# Patient Record
Sex: Male | Born: 1950 | Race: Black or African American | Hispanic: No | Marital: Married | Smoking: Current every day smoker
Health system: Southern US, Community
[De-identification: ages and names within clinical notes are randomized; demographics above are authoritative.]

## PROBLEM LIST (undated history)

## (undated) DIAGNOSIS — I10 Essential (primary) hypertension: Secondary | ICD-10-CM

## (undated) DIAGNOSIS — E119 Type 2 diabetes mellitus without complications: Secondary | ICD-10-CM

## (undated) HISTORY — PX: BLADDER SURGERY: SHX569

## (undated) HISTORY — PX: HERNIA REPAIR: SHX51

---

## 2011-10-19 ENCOUNTER — Ambulatory Visit: Payer: Self-pay

## 2014-12-22 ENCOUNTER — Ambulatory Visit: Payer: Self-pay | Admitting: Physician Assistant

## 2016-07-28 ENCOUNTER — Ambulatory Visit
Admission: EM | Admit: 2016-07-28 | Discharge: 2016-07-28 | Disposition: A | Discharge status: Home / Self Care | Payer: Self-pay | Attending: Emergency Medicine | Admitting: Emergency Medicine

## 2016-07-28 ENCOUNTER — Ambulatory Visit (INDEPENDENT_AMBULATORY_CARE_PROVIDER_SITE_OTHER): Payer: Self-pay

## 2016-07-28 DIAGNOSIS — J40 Bronchitis, not specified as acute or chronic: Secondary | ICD-10-CM

## 2016-07-28 HISTORY — DX: Type 2 diabetes mellitus without complications: E11.9

## 2016-07-28 HISTORY — DX: Essential (primary) hypertension: I10

## 2016-07-28 MED ORDER — ALBUTEROL SULFATE HFA 108 (90 BASE) MCG/ACT IN AERS: 1.0000 | INHALATION_SPRAY | Freq: Four times a day (QID) | RESPIRATORY_TRACT | 0 refills | Status: DC | PRN

## 2016-07-28 MED ORDER — AEROCHAMBER PLUS MISC: 2 refills | Status: DC

## 2016-07-28 MED ORDER — PREDNISONE 20 MG PO TABS: 40.0000 mg | ORAL_TABLET | Freq: Every day | ORAL | 0 refills | Status: AC

## 2016-07-28 MED ORDER — AZITHROMYCIN 250 MG PO TABS: 250.0000 mg | ORAL_TABLET | Freq: Every day | ORAL | 0 refills | Status: DC

## 2016-07-28 MED ORDER — GUAIFENESIN-CODEINE 100-10 MG/5ML PO SYRP: 10.0000 mL | ORAL_SOLUTION | Freq: Four times a day (QID) | ORAL | 0 refills | Status: DC | PRN

## 2016-07-28 NOTE — ED Triage Notes (Signed)
Patient complains of severe cough with heavy mucus production with sudden onset of yesterday. Patient states that he is also feeling weak.

## 2016-07-28 NOTE — ED Provider Notes (Signed)
HPI  SUBJECTIVE:  Alejandro Garza is a 65 y.o. male who presents with the acute onset of cough productive of yellowish, greenish, whitish sputum, fatigue yesterday. States he feels feverish but has no documented fevers at home. Reports chills, nasal congestion, rhinorrhea, postnasal drip. He reports chest soreness after coughing, but no chest pain. Reports wheezing, occasional shortness of breath. Reports some body aches, but no headaches. He reports sneezing and itchy watery eyes. He also states that he has been around others with similar symptoms. He tried Mucinex, Alka-Seltzer without improvement. There are no other aggravating or alleviating factors. He has not taken any antipyretic in the past 6-8 hours. He is concerned about pneumonia. He denies weight gain, lower extremity edema, PND, orthopnea, nocturia. He did get a flu shot this year. Patient has a questionable history of asthma, states he has an inhaler at home which he does not use. Also questionable history of CHF, patient states that he has had "fluid in his lungs" in the past, but is not sure about this diagnosis. He is smoker half pack a day for the past 20 years. Also diabetes, hypertension, allergies. Not sure if he has had pneumonia in the past. He also has OSA but has not used his CPAP machine yet. No history of GERD. PMD: [facility]    Past Medical History:  Diagnosis Date  . Diabetes (HCC)   . Hypertension     History reviewed. No pertinent surgical history.  No family history on file.  Social History  Substance Use Topics  . Smoking status: Current Every Day Smoker    Packs/day: 0.50  . Smokeless tobacco: Never Used  . Alcohol use Yes    No current facility-administered medications for this encounter.   Current Outpatient Prescriptions:  .  hydrochlorothiazide (HYDRODIURIL) 25 MG tablet, Take 25 mg by mouth daily., Disp: , Rfl:  .  metFORMIN (GLUCOPHAGE) 500 MG tablet, Take by mouth 2 (two) times  daily with a meal., Disp: , Rfl:  .  albuterol (PROVENTIL HFA;VENTOLIN HFA) 108 (90 Base) MCG/ACT inhaler, Inhale 1-2 puffs into the lungs every 6 (six) hours as needed for wheezing or shortness of breath., Disp: 1 Inhaler, Rfl: 0 .  azithromycin (ZITHROMAX) 250 MG tablet, Take 1 tablet (250 mg total) by mouth daily. 2 tabs po on day 1, 1 tab po on days 2-5, Disp: 6 tablet, Rfl: 0 .  guaiFENesin-codeine (CHERATUSSIN AC) 100-10 MG/5ML syrup, Take 10 mLs by mouth 4 (four) times daily as needed for cough., Disp: 120 mL, Rfl: 0 .  predniSONE (DELTASONE) 20 MG tablet, Take 2 tablets (40 mg total) by mouth daily with breakfast., Disp: 10 tablet, Rfl: 0 .  Spacer/Aero-Holding Chambers (AEROCHAMBER PLUS) inhaler, Use as instructed, Disp: 1 each, Rfl: 2  Allergies  Allergen Reactions  . Aspirin      ROS  As noted in HPI.   Physical Exam  BP (!) 168/75 (BP Location: Left Arm)   Pulse 66   Temp 97.7 F (36.5 C) (Tympanic)   Resp 17   Ht 6' 1.5" (1.867 m)   Wt 280 lb (127 kg)   SpO2 96%   BMI 36.44 kg/m   Constitutional: Well developed, well nourished, no acute distress. Speaking in full sentences. Coughing up whitish sputum. Eyes: PERRL, EOMI, conjunctiva normal bilaterally HENT: Normocephalic, atraumatic,mucus membranes moist. No nasal congestion. Patient unable to tolerate oropharyngeal exam. Respiratory: Clear to auscultation bilaterally, no rales, no wheezing, no rhonchi. Good air movement. Cardiovascular: Normal  rate and rhythm, no murmurs, no gallops, no rubs GI: nondistended,  skin: No rash, skin intact Musculoskeletal: calves symmetric, nontender No edema, no deformities Neurologic: Alert & oriented x 3, CN II-XII grossly intact, no motor deficits, sensation grossly intact Psychiatric: Speech and behavior appropriate   ED Course   Medications - No data to display  Orders Placed This Encounter  Procedures  . DG Chest 2 View    Standing Status:   Standing    Number of  Occurrences:   1    Order Specific Question:   Reason for Exam (SYMPTOM  OR DIAGNOSIS REQUIRED)    Answer:   cough r/o PNA CHF   No results found for this or any previous visit (from the past 24 hour(s)). Dg Chest 2 View  Result Date: 07/28/2016 CLINICAL DATA:  Cough and shortness of breath for 2 days. EXAM: CHEST  2 VIEW COMPARISON:  None. FINDINGS: Upper limits normal heart size noted. The mediastinal silhouette is otherwise unremarkable. Elevation of the right hemidiaphragm is present with mild right basilar atelectasis/scarring. There is no evidence of focal airspace disease, pulmonary edema, suspicious pulmonary nodule/mass, pleural effusion, or pneumothorax. No acute bony abnormalities are identified. IMPRESSION: Upper limits normal heart size with right hemidiaphragm elevation-likely chronic. No evidence of pneumonia or acute abnormality otherwise. Electronically Signed   By: Harmon PierJeffrey  Hu M.D.   On: 07/28/2016 09:37    ED Clinical Impression  Bronchitis   ED Assessment/Plan  Presentation most consistent with a bronchitis/URI however we'll get a chest x-ray rule out pneumonia or CHF as patient does have multiple comorbidities. Doubt CHF as he has no lower extremity edema or other sx.   Imaging independently reviewed. No evidence pneumonia, CHF or acute cardiopulmonary disease. See radiology report for details.  We'll treat for bronchitis to send home with regular albuterol with spacer, steroids, although advised him that this will elevate his sugars, and a wait-and-see prescription of azithromycin as he does have multiple comorbidities that could easily lead to pneumonia. Follow Up with PMD at [facility] as needed. To the ER gets worse.  Discussed  imaging, MDM, plan and followup with patient. Discussed sn/sx that should prompt return to the ED. Patient agrees with plan.   Meds ordered this encounter  Medications  . hydrochlorothiazide (HYDRODIURIL) 25 MG tablet    Sig: Take 25 mg by mouth  daily.  . metFORMIN (GLUCOPHAGE) 500 MG tablet    Sig: Take by mouth 2 (two) times daily with a meal.  . predniSONE (DELTASONE) 20 MG tablet    Sig: Take 2 tablets (40 mg total) by mouth daily with breakfast.    Dispense:  10 tablet    Refill:  0  . albuterol (PROVENTIL HFA;VENTOLIN HFA) 108 (90 Base) MCG/ACT inhaler    Sig: Inhale 1-2 puffs into the lungs every 6 (six) hours as needed for wheezing or shortness of breath.    Dispense:  1 Inhaler    Refill:  0  . Spacer/Aero-Holding Chambers (AEROCHAMBER PLUS) inhaler    Sig: Use as instructed    Dispense:  1 each    Refill:  2  . guaiFENesin-codeine (CHERATUSSIN AC) 100-10 MG/5ML syrup    Sig: Take 10 mLs by mouth 4 (four) times daily as needed for cough.    Dispense:  120 mL    Refill:  0  . azithromycin (ZITHROMAX) 250 MG tablet    Sig: Take 1 tablet (250 mg total) by mouth daily. 2 tabs po on day  1, 1 tab po on days 2-5    Dispense:  6 tablet    Refill:  0    *This clinic note was created using Scientist, clinical (histocompatibility and immunogenetics). Therefore, there may be occasional mistakes despite careful proofreading.  ?   Domenick Gong, MD 07/28/16 1038

## 2016-07-28 NOTE — Discharge Instructions (Signed)
Take two puffs from your albuterol inhaler every 4-6 hours as needed for coughing and wheezing. Finish the steroids unless your doctor tells you to stop. Try the albuterol and steroids and cough syrup for a day or 2, and if you're not getting better or if you've a fever above 100.4, then start the antibiotics. The prednisone will elevate your sugar while you're taking it, but it should normalize when he finished the steroids.. Return to the ER if you get worse, have a fever >100.4, or any other concerns.   Go to www.goodrx.com to look up your medications. This will give you a list of where you can find your prescriptions at the most affordable prices.

## 2017-10-08 ENCOUNTER — Other Ambulatory Visit: Payer: Self-pay

## 2017-10-08 ENCOUNTER — Ambulatory Visit
Admission: EM | Admit: 2017-10-08 | Discharge: 2017-10-08 | Disposition: A | Discharge status: Home / Self Care | Payer: 59 | Attending: Emergency Medicine | Admitting: Emergency Medicine

## 2017-10-08 ENCOUNTER — Encounter: Payer: Self-pay | Admitting: Emergency Medicine

## 2017-10-08 ENCOUNTER — Ambulatory Visit (INDEPENDENT_AMBULATORY_CARE_PROVIDER_SITE_OTHER): Payer: 59

## 2017-10-08 DIAGNOSIS — R05 Cough: Secondary | ICD-10-CM

## 2017-10-08 DIAGNOSIS — J22 Unspecified acute lower respiratory infection: Secondary | ICD-10-CM | POA: Diagnosis not present

## 2017-10-08 DIAGNOSIS — R0602 Shortness of breath: Secondary | ICD-10-CM | POA: Diagnosis not present

## 2017-10-08 MED ORDER — BENZONATATE 200 MG PO CAPS: 200.0000 mg | ORAL_CAPSULE | Freq: Three times a day (TID) | ORAL | 0 refills | Status: DC | PRN

## 2017-10-08 MED ORDER — AEROCHAMBER PLUS MISC: 2 refills | Status: DC

## 2017-10-08 MED ORDER — ALBUTEROL SULFATE HFA 108 (90 BASE) MCG/ACT IN AERS: 1.0000 | INHALATION_SPRAY | Freq: Four times a day (QID) | RESPIRATORY_TRACT | 0 refills | Status: DC | PRN

## 2017-10-08 MED ORDER — IPRATROPIUM-ALBUTEROL 0.5-2.5 (3) MG/3ML IN SOLN
3.0000 mL | Freq: Once | RESPIRATORY_TRACT | Status: AC
  Administered 2017-10-08: 3 mL via RESPIRATORY_TRACT

## 2017-10-08 MED ORDER — DOXYCYCLINE HYCLATE 100 MG PO CAPS: 100.0000 mg | ORAL_CAPSULE | Freq: Two times a day (BID) | ORAL | 0 refills | Status: AC

## 2017-10-08 MED ORDER — HYDROCOD POLST-CPM POLST ER 10-8 MG/5ML PO SUER: 5.0000 mL | Freq: Two times a day (BID) | ORAL | 0 refills | Status: DC | PRN

## 2017-10-08 NOTE — Discharge Instructions (Signed)
1-2 puffs from your albuterol inhaler every 4-6 hours as needed for coughing, wheezing.  Use a spacer.  Finish the doxycycline unless your doctor tells you to stop.  Start some Flonase.  Tessalon Perles for the cough during the day, Tussionex for the cough at night

## 2017-10-08 NOTE — ED Provider Notes (Signed)
HPI  SUBJECTIVE:  Alejandro Garza is a 66 y.o. male who presents with 1-2 weeks of chest congestion, nasal congestion, rhinorrhea, postnasal drip, scratchy throat.  He reports the cough productive of the same material as his nasal congestion.  Reports body aches, sinus pain and pressure, wheezing, shortness of breath.  Reports chest soreness secondary to the cough.  No documented fevers at home, but has not taken his temperature.  Reports chills.  No upper dental pain.  States that overall he is getting worse.  He tried Sudafed, NyQuil, Robitussin with temporary improvement in symptoms.  No aggravating factors.  No antibiotics in the past month.  No antipyretic in the past 6-8 hours.  States that he is unable to sleep at night secondary to the cough.  He has a past medical history of asthma, and is a smoker.  Also diabetes, hypertension, hypercholesterolemia.  No history of emphysema, COPD.  [facility]  Past Medical History:  Diagnosis Date  . Diabetes (HCC)   . Hypertension     Past Surgical History:  Procedure Laterality Date  . BLADDER SURGERY    . HERNIA REPAIR      Family History  Problem Relation Age of Onset  . Diabetes Mother   . Diabetes Father   . Cancer Father        colon    Social History   Tobacco Use  . Smoking status: Current Every Day Smoker    Packs/day: 0.50  . Smokeless tobacco: Never Used  Substance Use Topics  . Alcohol use: Yes    Alcohol/week: 1.2 oz    Types: 2 Standard drinks or equivalent per week  . Drug use: No    No current facility-administered medications for this encounter.   Current Outpatient Medications:  .  hydrochlorothiazide (HYDRODIURIL) 25 MG tablet, Take 25 mg by mouth daily., Disp: , Rfl:  .  metFORMIN (GLUCOPHAGE) 500 MG tablet, Take by mouth 2 (two) times daily with a meal., Disp: , Rfl:  .  albuterol (PROVENTIL HFA;VENTOLIN HFA) 108 (90 Base) MCG/ACT inhaler, Inhale 1-2 puffs into the lungs every 6 (six) hours  as needed for wheezing or shortness of breath., Disp: 1 Inhaler, Rfl: 0 .  benzonatate (TESSALON) 200 MG capsule, Take 1 capsule (200 mg total) by mouth 3 (three) times daily as needed for cough., Disp: 30 capsule, Rfl: 0 .  chlorpheniramine-HYDROcodone (TUSSIONEX PENNKINETIC ER) 10-8 MG/5ML SUER, Take 5 mLs by mouth every 12 (twelve) hours as needed for cough., Disp: 120 mL, Rfl: 0 .  doxycycline (VIBRAMYCIN) 100 MG capsule, Take 1 capsule (100 mg total) by mouth 2 (two) times daily for 7 days., Disp: 14 capsule, Rfl: 0 .  Spacer/Aero-Holding Chambers (AEROCHAMBER PLUS) inhaler, Use as instructed, Disp: 1 each, Rfl: 2  Allergies  Allergen Reactions  . Aspirin      ROS  As noted in HPI.   Physical Exam  BP (!) 134/59 (BP Location: Left Arm)   Pulse 72   Temp 98.2 F (36.8 C) (Oral)   Resp 20   Ht 6\' 2"  (1.88 m)   Wt 290 lb (131.5 kg)   SpO2 99%   BMI 37.23 kg/m   Constitutional: Well developed, well nourished, no acute distress Eyes:  EOMI, conjunctiva normal bilaterally HENT: Normocephalic, atraumatic,mucus membranes moist.  Mild nasal congestion, normal turbinates.  No sinus tenderness.  Unable to adequately visualize oropharynx. Respiratory: Normal inspiratory effort, fair air movement, lungs clear bilaterally cardiovascular: Normal rate regular rhythm, no  murmurs, gallops GI: nondistended skin: No rash, skin intact Musculoskeletal: no deformities Neurologic: Alert & oriented x 3, no focal neuro deficits Psychiatric: Speech and behavior appropriate   ED Course   Medications  ipratropium-albuterol (DUONEB) 0.5-2.5 (3) MG/3ML nebulizer solution 3 mL (3 mLs Nebulization Given 10/08/17 1602)    Orders Placed This Encounter  Procedures  . DG Chest 2 View    Standing Status:   Standing    Number of Occurrences:   1    Order Specific Question:   Reason for Exam (SYMPTOM  OR DIAGNOSIS REQUIRED)    Answer:   r/o pna edema effusion    No results found for this or any  previous visit (from the past 24 hour(s)). Dg Chest 2 View  Result Date: 10/08/2017 CLINICAL DATA:  Coughing, SOB, wheezing X 2 weeks. Pt states he just doesn't feel well. Fever he believes off/on. No surgeries. Smoker. EXAM: CHEST  2 VIEW COMPARISON:  07/28/2016 FINDINGS: Cardiac silhouette is normal in size. No mediastinal or hilar masses. No evidence of adenopathy. Clear lungs. Elevated right hemidiaphragm, stable. No pleural effusion or pneumothorax. Skeletal structures are intact. IMPRESSION: No active cardiopulmonary disease. Electronically Signed   By: Amie Portland M.D.   On: 10/08/2017 15:58    ED Clinical Impression  LRTI (lower respiratory tract infection)   ED Assessment/Plan  [place] Narcotic database reviewed for this patient, and feel that the risk/benefit ratio today is favorable for proceeding with a prescription for controlled substance.  2 opiate prescriptions in 2 years.  Imaging independently reviewed.  No pneumonia, pleural effusion, pneumothorax.  See radiology report for details.  On reevaluation, patient states that he feels better.  Lungs still clear.  Good air movement. Presentation consistent with a bronchitis/lower respiratory tract infection., he has no sinus tenderness consistent with sinusitis.  Plan to send home with albuterol inhaler with a spacer, 1-2 puffs every 4-6 hours as needed for coughing, wheezing, shortness of breath.  Also, Tessalon Perles during the day, Tussionex cough syrup for night and doxycycline given duration of symptoms and the fact that he is overall getting worse  Discussed imaging, MDM, plan and followup with patient. Discussed sn/sx that should prompt return to the ED. patient agrees with plan.   Meds ordered this encounter  Medications  . ipratropium-albuterol (DUONEB) 0.5-2.5 (3) MG/3ML nebulizer solution 3 mL  . albuterol (PROVENTIL HFA;VENTOLIN HFA) 108 (90 Base) MCG/ACT inhaler    Sig: Inhale 1-2 puffs into the lungs every 6 (six)  hours as needed for wheezing or shortness of breath.    Dispense:  1 Inhaler    Refill:  0  . Spacer/Aero-Holding Chambers (AEROCHAMBER PLUS) inhaler    Sig: Use as instructed    Dispense:  1 each    Refill:  2  . benzonatate (TESSALON) 200 MG capsule    Sig: Take 1 capsule (200 mg total) by mouth 3 (three) times daily as needed for cough.    Dispense:  30 capsule    Refill:  0  . chlorpheniramine-HYDROcodone (TUSSIONEX PENNKINETIC ER) 10-8 MG/5ML SUER    Sig: Take 5 mLs by mouth every 12 (twelve) hours as needed for cough.    Dispense:  120 mL    Refill:  0  . doxycycline (VIBRAMYCIN) 100 MG capsule    Sig: Take 1 capsule (100 mg total) by mouth 2 (two) times daily for 7 days.    Dispense:  14 capsule    Refill:  0    *This  clinic note was created using Scientist, clinical (histocompatibility and immunogenetics)Dragon dictation software. Therefore, there may be occasional mistakes despite careful proofreading.   ?   Domenick GongMortenson, Genifer Lazenby, MD 10/08/17 1627

## 2017-10-08 NOTE — ED Triage Notes (Signed)
Patient in today c/o 1 week history of chest congestion, nasal congestion and productive cough (yellow). Patient has felt feverish, but has not taken his temperature. Patient does feel weak. Patient has tried OTC Robitussin and Nyquil without relief.

## 2018-02-11 IMAGING — CR DG CHEST 2V
2 series · 3 of 3 positions shown · non-contrast
Comparison: None.

CLINICAL DATA: Cough and shortness of breath for 2 days.

EXAM:
CHEST  2 VIEW

[Series 1: chest pa · 0.14mm/px · 2 of 2 slices shown]
[im 1/2]
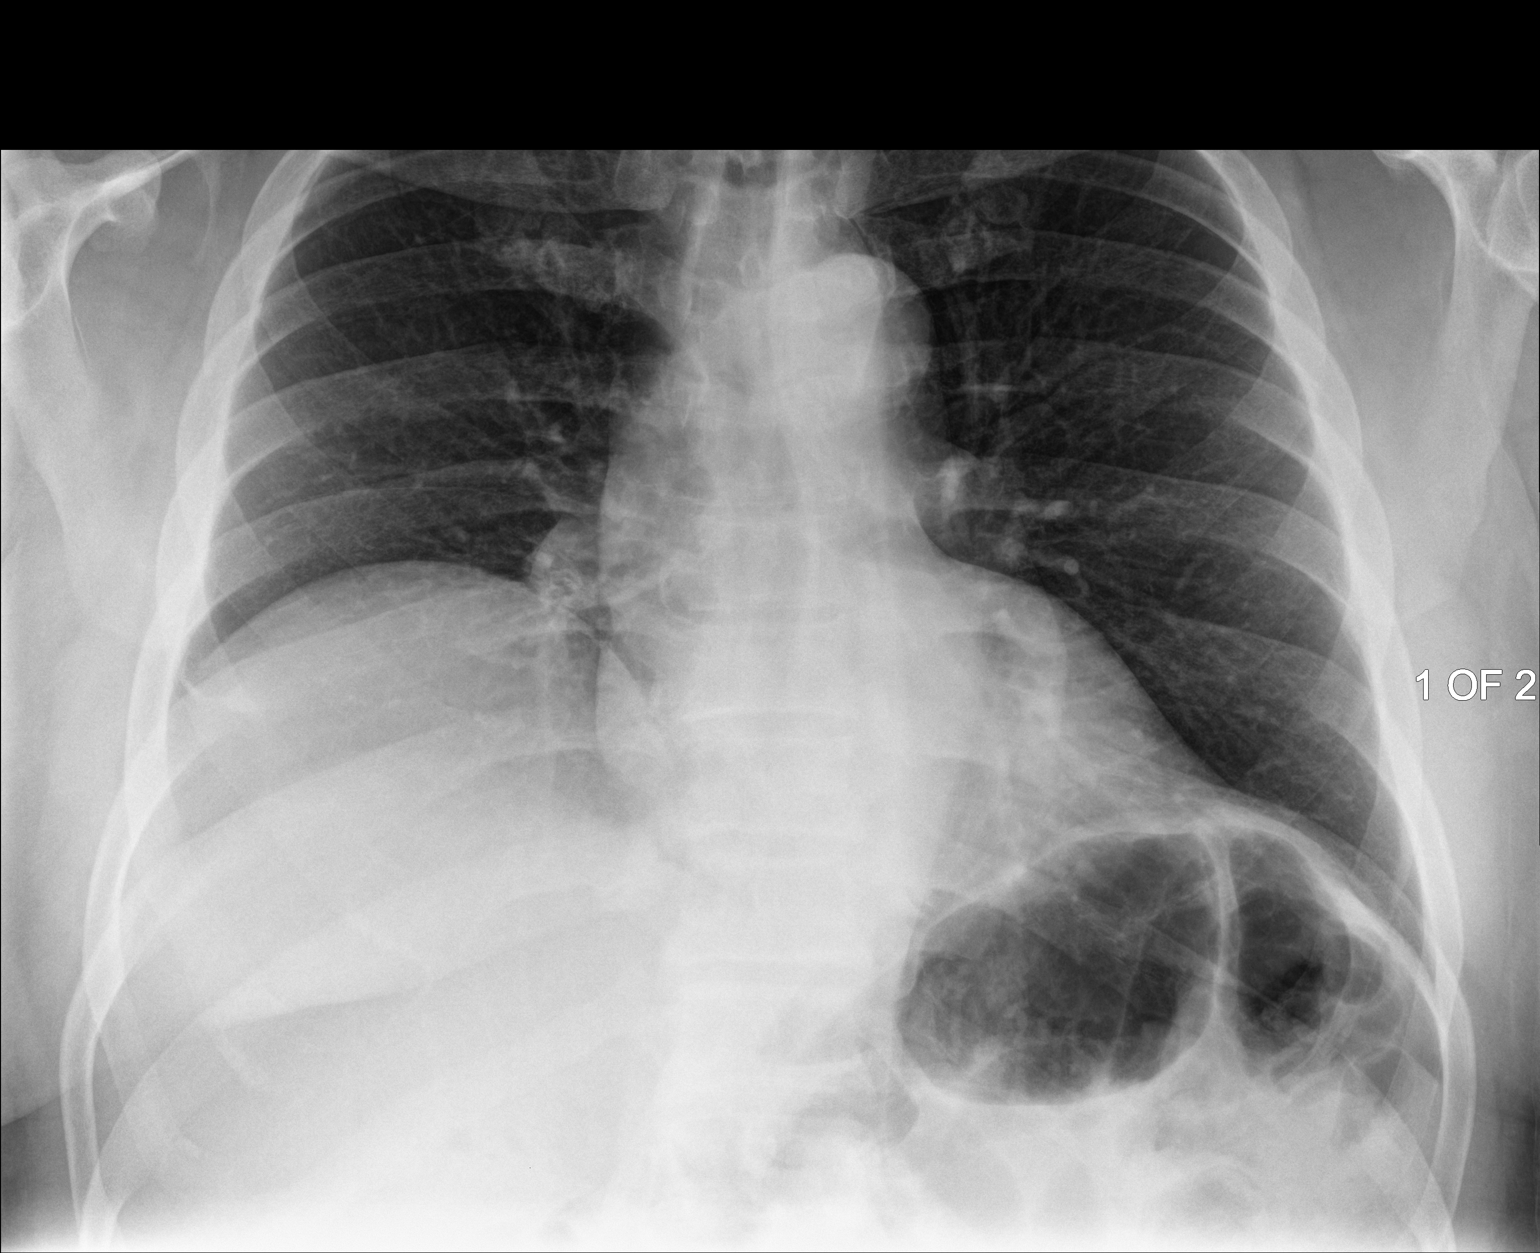
[im 2/2]
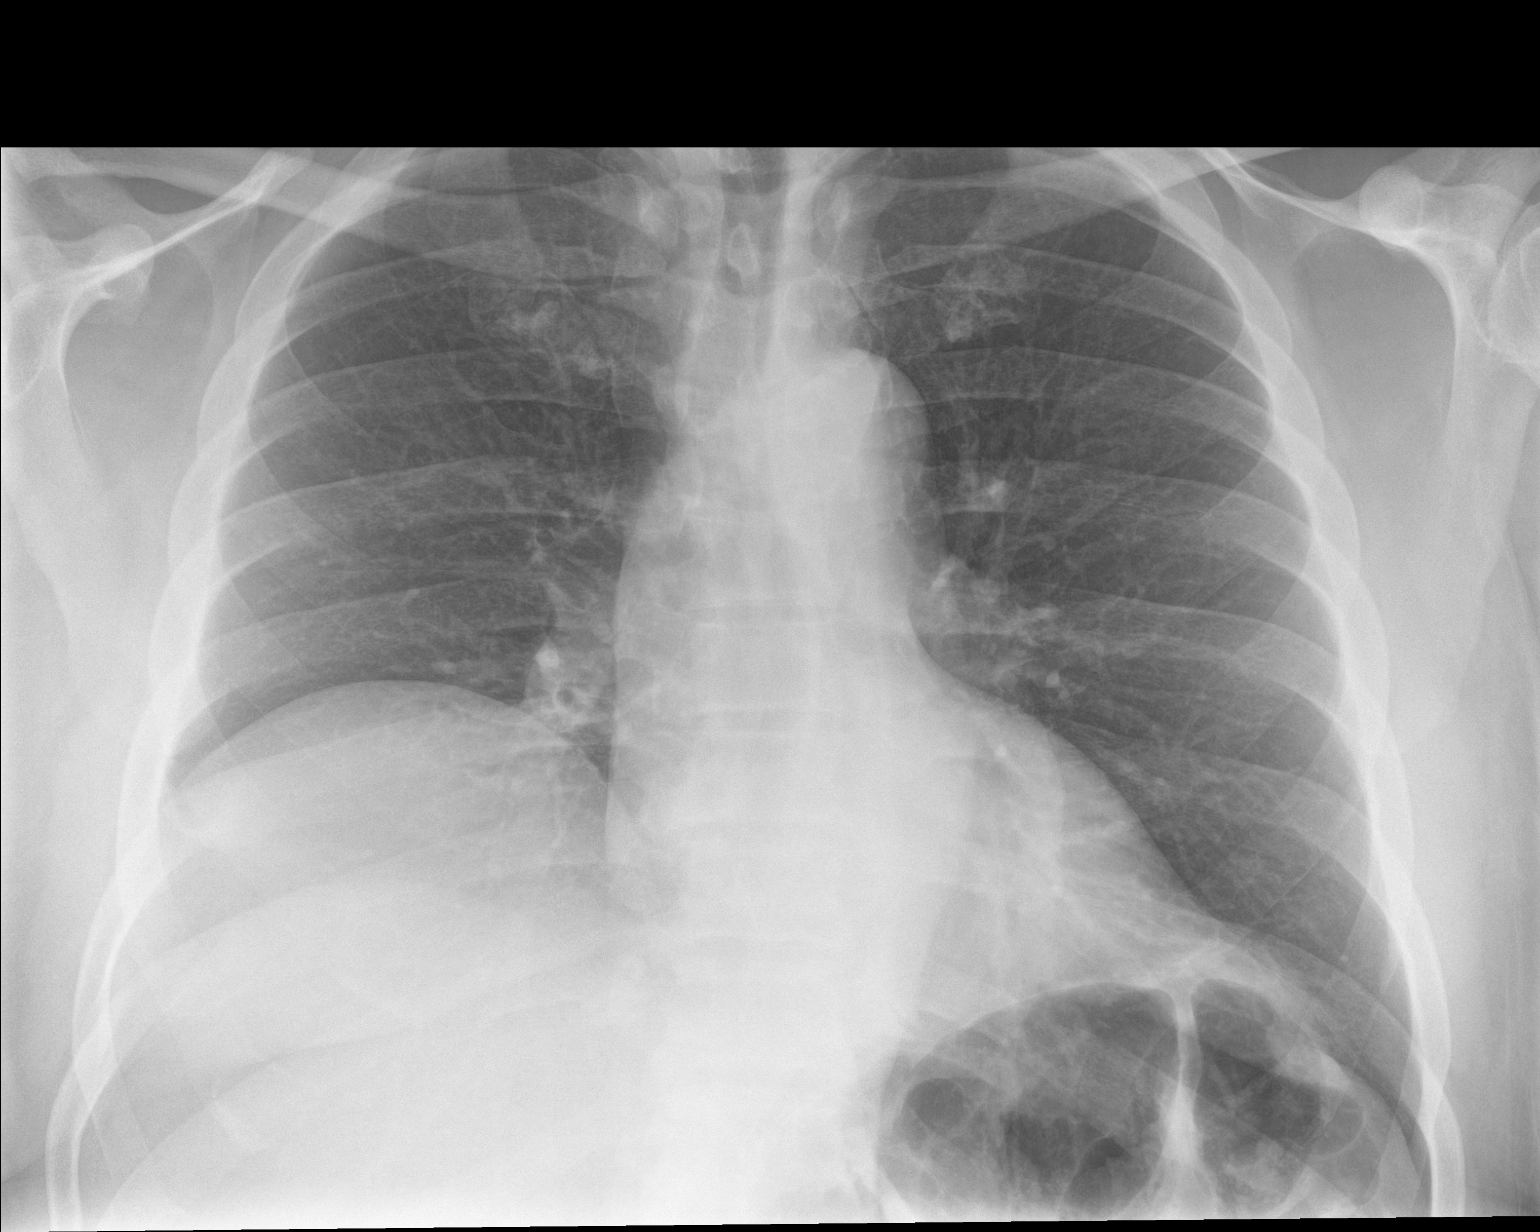

[chest lat]
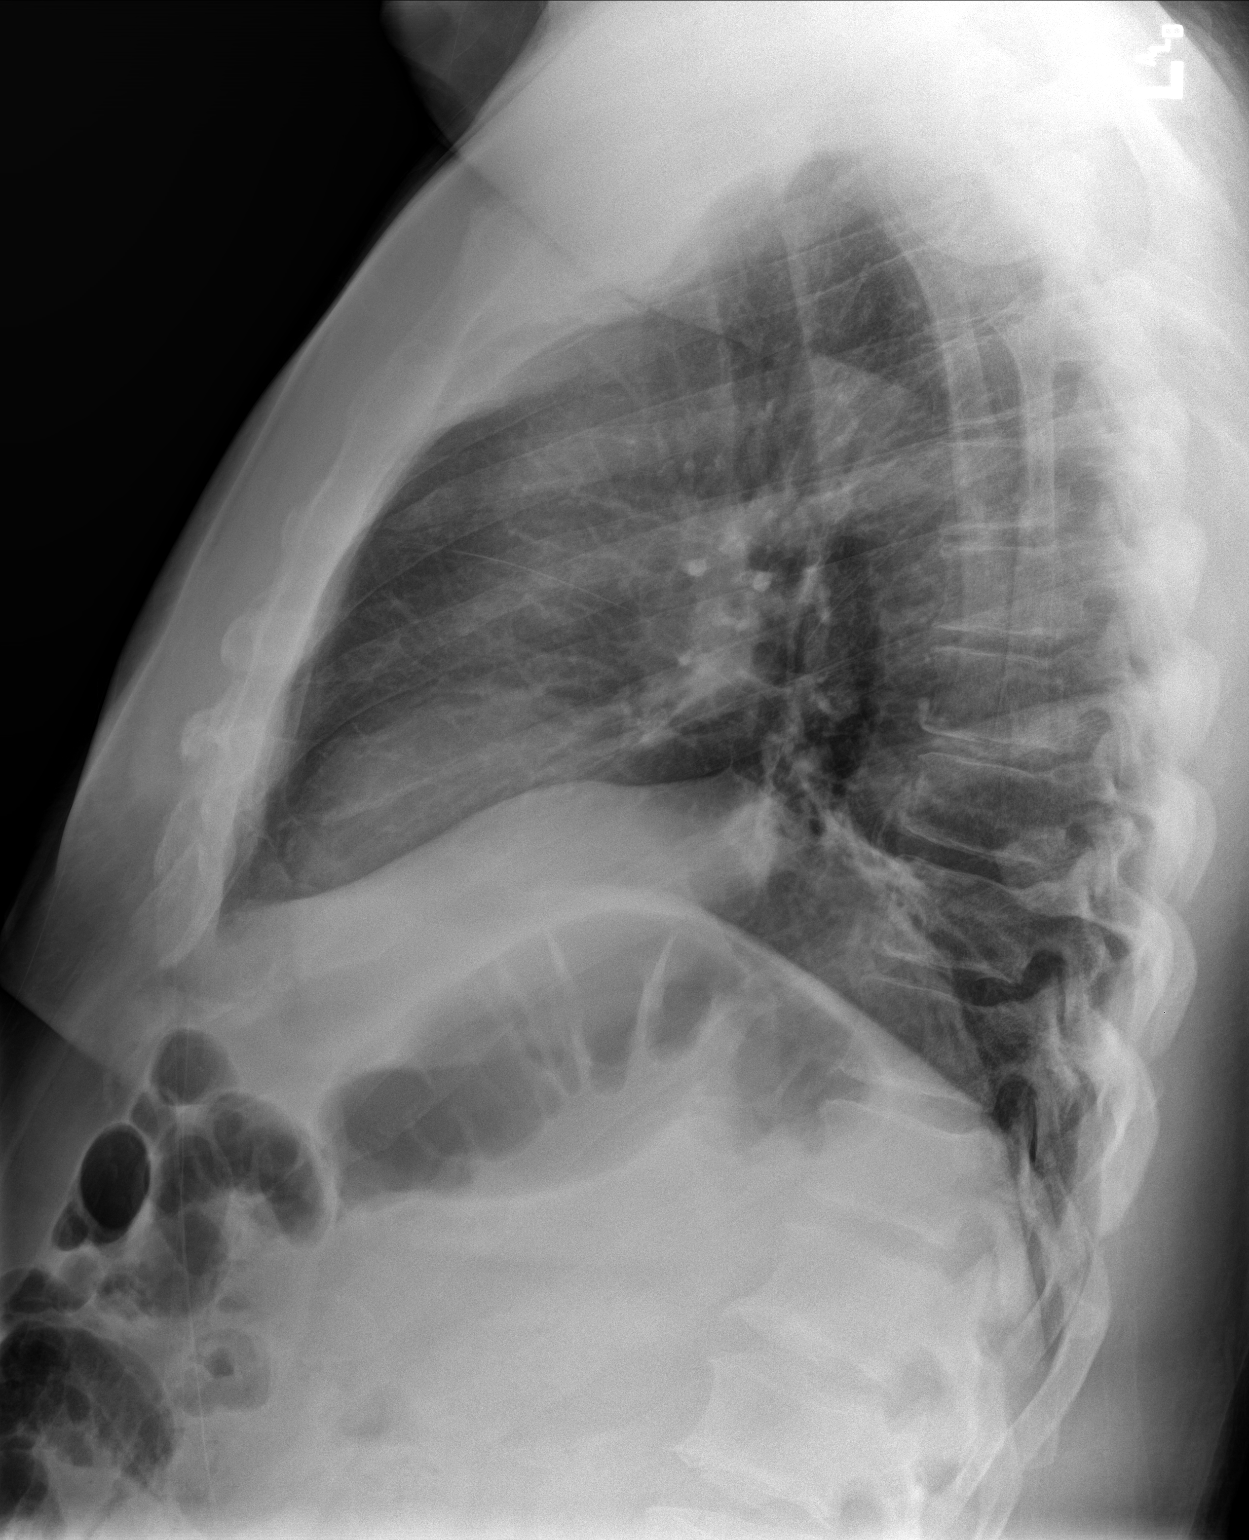

[3 of 3 positions shown; findings below may reference images not displayed]

FINDINGS: Upper limits normal heart size noted. The mediastinal silhouette is
otherwise unremarkable.

Elevation of the right hemidiaphragm is present with mild right
basilar atelectasis/scarring.

There is no evidence of focal airspace disease, pulmonary edema,
suspicious pulmonary nodule/mass, pleural effusion, or pneumothorax.

No acute bony abnormalities are identified.
IMPRESSION: Upper limits normal heart size with right hemidiaphragm
elevation-likely chronic.

No evidence of pneumonia or acute abnormality otherwise.

## 2018-10-04 ENCOUNTER — Other Ambulatory Visit: Payer: Self-pay

## 2018-10-04 ENCOUNTER — Encounter: Payer: Self-pay | Admitting: Emergency Medicine

## 2018-10-04 ENCOUNTER — Ambulatory Visit
Admission: EM | Admit: 2018-10-04 | Discharge: 2018-10-04 | Disposition: A | Discharge status: Home / Self Care | Payer: 59 | Attending: Family Medicine | Admitting: Family Medicine

## 2018-10-04 DIAGNOSIS — M542 Cervicalgia: Secondary | ICD-10-CM | POA: Insufficient documentation

## 2018-10-04 MED ORDER — METAXALONE 800 MG PO TABS: 800.0000 mg | ORAL_TABLET | Freq: Three times a day (TID) | ORAL | 0 refills | Status: DC | PRN

## 2018-10-04 MED ORDER — MELOXICAM 15 MG PO TABS: 15.0000 mg | ORAL_TABLET | Freq: Every day | ORAL | 0 refills | Status: DC | PRN

## 2018-10-04 NOTE — ED Triage Notes (Signed)
Patient in today c/o neck pain and stiffness x 1 week.

## 2018-10-04 NOTE — ED Provider Notes (Signed)
[facility]    CSN: [id number] Arrival date & time: 10/04/18  0903  History   Chief Complaint Chief Complaint  Patient presents with  . Neck Pain   HPI  67 year old male presents with neck pain.  Bilateral, posterior neck pain for the past 2 weeks.  Has been worse this past week.  Moderate in severity.  Patient denies the use of heat or ice or Tylenol for his pain.  Patient states that he takes Flexeril twice a day and this is not resolved his pain.  Seems to be worse with activity.  No relieving factors.  No reports of radicular symptoms.  No other complaints or concerns at this time.  PMH, Surgical Hx, Family Hx, Social History reviewed and updated as below.  Past Medical History:  Diagnosis Date  . Diabetes (HCC)   . Hypertension    Past Surgical History:  Procedure Laterality Date  . BLADDER SURGERY    . HERNIA REPAIR     Home Medications    Prior to Admission medications   Medication Sig Start Date End Date Taking? Authorizing Provider  albuterol (PROVENTIL HFA;VENTOLIN HFA) 108 (90 Base) MCG/ACT inhaler Inhale 1-2 puffs into the lungs every 6 (six) hours as needed for wheezing or shortness of breath. 10/08/17  Yes Domenick GongMortenson, Ashley, MD  alfuzosin (UROXATRAL) 10 MG 24 hr tablet Take 10 mg by mouth daily with breakfast.   Yes [provider]  amLODipine (NORVASC) 10 MG tablet Take 10 mg by mouth daily.   Yes [provider]  cholecalciferol (VITAMIN D3) 25 MCG (1000 UT) tablet Take 1,000 Units by mouth daily.   Yes [provider]  fluticasone (FLONASE) 50 MCG/ACT nasal spray Place 2 sprays into both nostrils daily.   Yes [provider]  glipiZIDE (GLUCOTROL XL) 5 MG 24 hr tablet Take 5 mg by mouth daily with breakfast.   Yes [provider]  hydrALAZINE (APRESOLINE) 10 MG tablet Take 10 mg by mouth 2 (two) times daily. Take 2 tablet two times per day   Yes [provider]  hydrochlorothiazide  (HYDRODIURIL) 25 MG tablet Take 25 mg by mouth daily.   Yes [provider]  Spacer/Aero-Holding Chambers (AEROCHAMBER PLUS) inhaler Use as instructed 10/08/17  Yes Domenick GongMortenson, Ashley, MD  thiamine 100 MG tablet Take 100 mg by mouth daily.   Yes [provider]  meloxicam (MOBIC) 15 MG tablet Take 1 tablet (15 mg total) by mouth daily as needed. 10/04/18   Tommie Samsook, Titus Drone G, DO  metaxalone (SKELAXIN) 800 MG tablet Take 1 tablet (800 mg total) by mouth 3 (three) times daily as needed. 10/04/18   Tommie Samsook, Callen Vancuren G, DO  metFORMIN (GLUCOPHAGE) 500 MG tablet Take by mouth 2 (two) times daily with a meal.    [provider]    Family History Family History  Problem Relation Age of Onset  . Diabetes Mother   . Diabetes Father   . Cancer Father        colon    Social History Social History   Tobacco Use  . Smoking status: Current Every Day Smoker    Packs/day: 0.50  . Smokeless tobacco: Never Used  Substance Use Topics  . Alcohol use: Yes    Alcohol/week: 2.0 standard drinks    Types: 2 Standard drinks or equivalent per week  . Drug use: No     Allergies   Aspirin   Review of Systems Review of Systems  Constitutional: Negative.   Musculoskeletal:  Positive for neck pain and neck stiffness.   Physical Exam Triage Vital Signs ED Triage Vitals  Enc Vitals Group     BP 10/04/18 0928 (!) 168/101     Pulse Rate 10/04/18 0928 79     Resp 10/04/18 0928 16     Temp 10/04/18 0928 97.7 F (36.5 C)     Temp Source 10/04/18 0928 Oral     SpO2 10/04/18 0928 98 %     Weight 10/04/18 0930 (!) 312 lb (141.5 kg)     Height 10/04/18 0930 6\' 2"  (1.88 m)     Head Circumference --      Peak Flow --      Pain Score 10/04/18 0929 6     Pain Loc --      Pain Edu? --      Excl. in GC? --    Updated Vital Signs BP (!) 168/101 (BP Location: Left Arm)   Pulse 79   Temp 97.7 F (36.5 C) (Oral)   Resp 16   Ht 6\' 2"  (1.88 m)   Wt (!) 141.5 kg   SpO2 98%   BMI 40.06  kg/m   Visual Acuity Right Eye Distance:   Left Eye Distance:   Bilateral Distance:    Right Eye Near:   Left Eye Near:    Bilateral Near:     Physical Exam Vitals signs and nursing note reviewed.  Constitutional:      General: He is not in acute distress.    Appearance: Normal appearance.  HENT:     Head: Normocephalic and atraumatic.  Eyes:     General:        Right eye: No discharge.        Left eye: No discharge.     Conjunctiva/sclera: Conjunctivae normal.  Neck:     Comments: Patient with tenderness of the paraspinal muscles bilaterally as well as of the trapezius muscles. Cardiovascular:     Rate and Rhythm: Normal rate and regular rhythm.  Pulmonary:     Effort: Pulmonary effort is normal.     Breath sounds: Normal breath sounds.  Neurological:     Mental Status: He is alert.  Psychiatric:        Mood and Affect: Mood normal.        Behavior: Behavior normal.    UC Treatments / Results  Labs (all labs ordered are listed, but only abnormal results are displayed) Labs Reviewed - No data to display  EKG None  Radiology No results found.  Procedures Procedures (including critical care time)  Medications Ordered in UC Medications - No data to display  Initial Impression / Assessment and Plan / UC Course  I have reviewed the triage vital signs and the nursing notes.  Pertinent labs & imaging results that were available during my care of the patient were reviewed by me and considered in my medical decision making (see chart for details).    [age] year old male presents with musculoskeletal neck pain.  Treating with meloxicam.  Stop Flexeril.  Start Skelaxin.  Supportive care.  Final Clinical Impressions(s) / UC Diagnoses   Final diagnoses:  Neck pain     Discharge Instructions     Stop the flexeril.  Start using the skelaxin and mobic.  If persist, see Orthopedics.  Take care  Dr. Adriana Simasook    ED Prescriptions    Medication Sig Dispense  Auth. Provider   metaxalone (SKELAXIN) 800 MG tablet Take 1 tablet (800 mg total)  by mouth 3 (three) times daily as needed. 30 tablet Danyla Wattley G, DO   meloxicam (MOBIC) 15 MG tablet Take 1 tablet (15 mg total) by mouth daily as needed. 14 tablet Tommie Sams, DO     Controlled Substance Prescriptions [place] Controlled Substance Registry consulted? Not Applicable   Tommie Sams, DO 10/04/18 1000

## 2018-10-04 NOTE — Discharge Instructions (Signed)
Stop the flexeril.  Start using the skelaxin and mobic.  If persist, see Orthopedics.  Take care  Dr. Adriana Simasook

## 2019-04-24 IMAGING — CR DG CHEST 2V
2 series · 2 of 2 positions shown · non-contrast
Comparison: 07/28/2016

CLINICAL DATA: Coughing, SOB, wheezing X 2 weeks. Pt states he just
doesn't feel well. Fever he believes off/on. No surgeries. Smoker.

EXAM:
CHEST  2 VIEW

[chest pa]
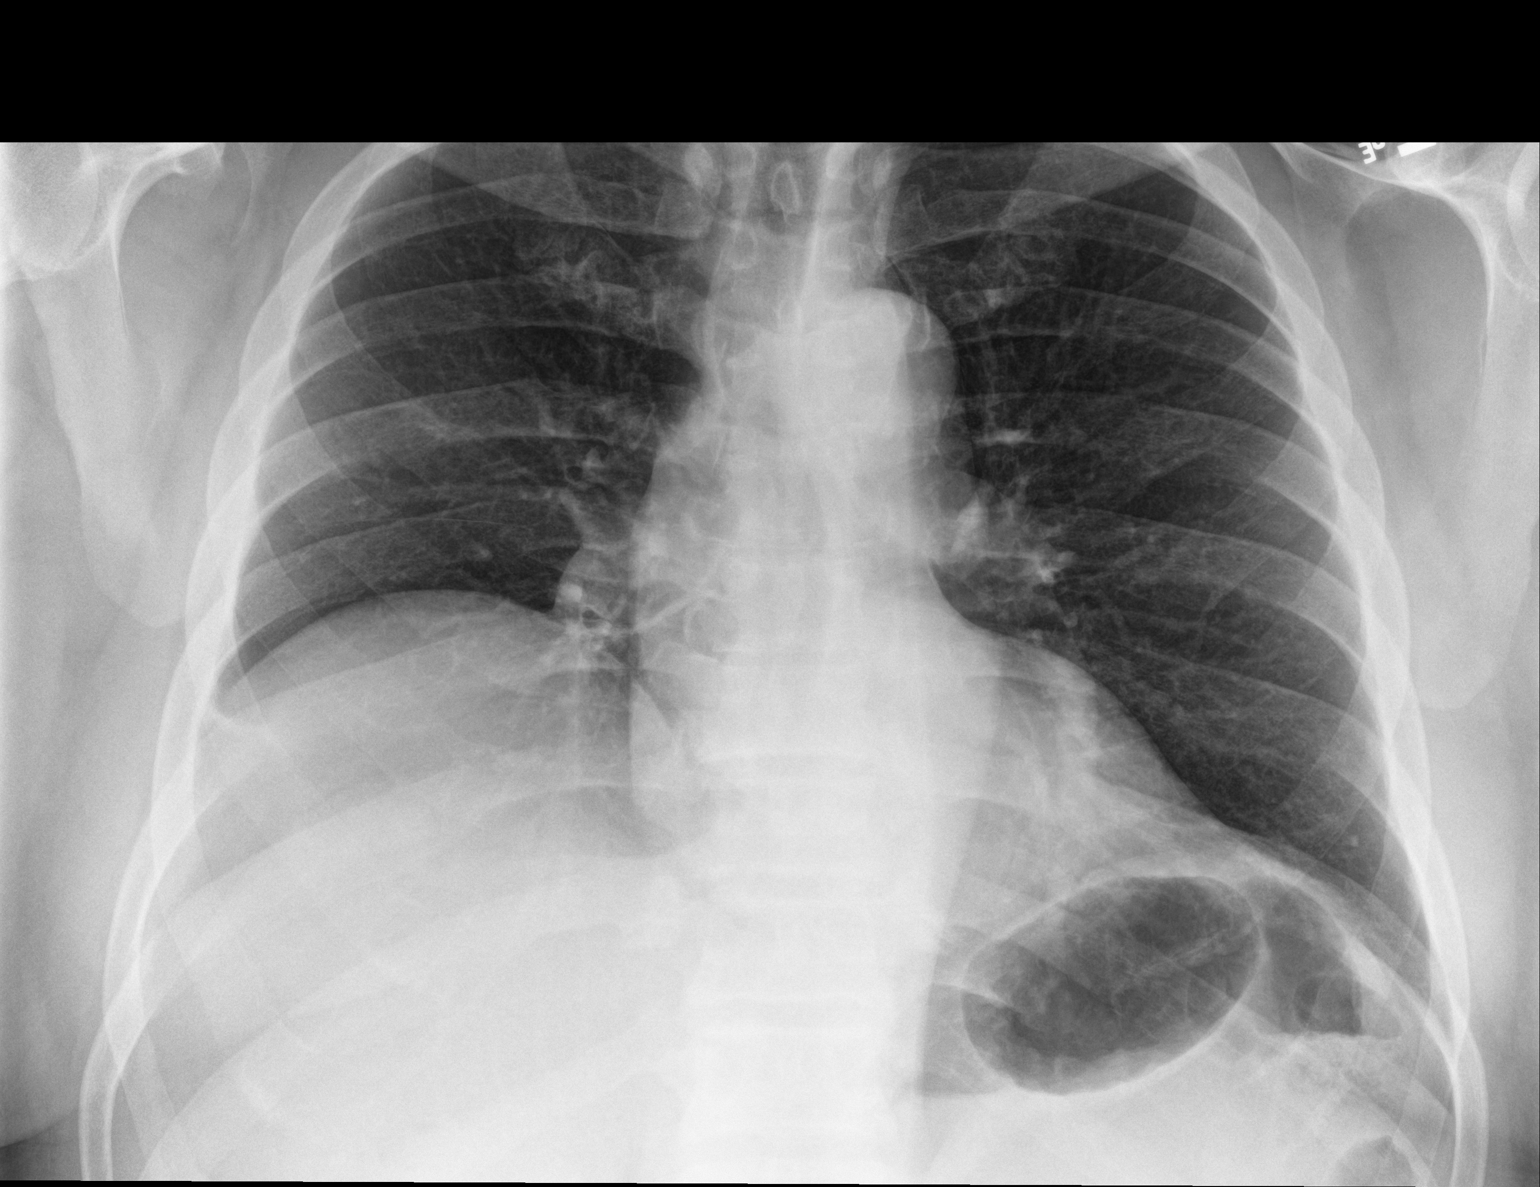

[chest lat]
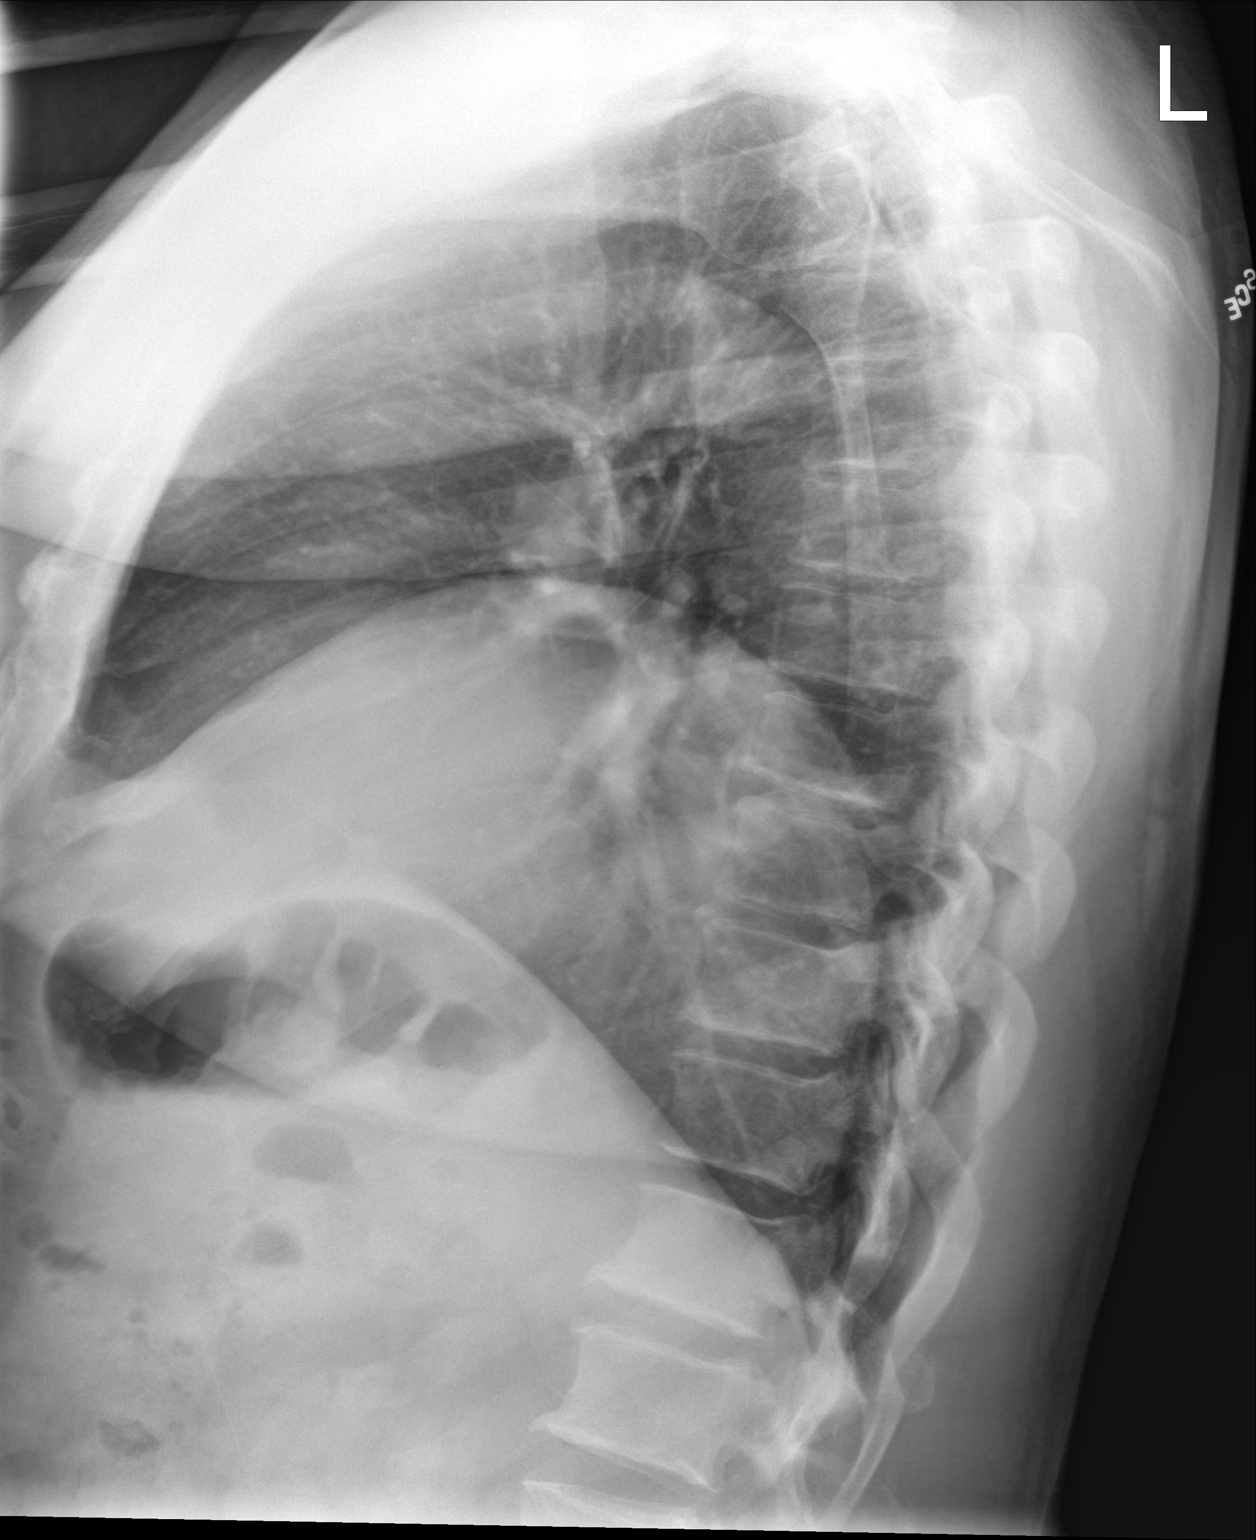

[2 of 2 positions shown; findings below may reference images not displayed]

FINDINGS: Cardiac silhouette is normal in size. No mediastinal or hilar
masses. No evidence of adenopathy.

Clear lungs.

Elevated right hemidiaphragm, stable.

No pleural effusion or pneumothorax.

Skeletal structures are intact.
IMPRESSION: No active cardiopulmonary disease.

## 2020-07-23 ENCOUNTER — Other Ambulatory Visit: Payer: Self-pay

## 2020-07-23 ENCOUNTER — Ambulatory Visit
Admission: EM | Admit: 2020-07-23 | Discharge: 2020-07-23 | Disposition: A | Discharge status: Home / Self Care | Payer: Medicare Other | Attending: Physician Assistant | Admitting: Physician Assistant

## 2020-07-23 DIAGNOSIS — S161XXA Strain of muscle, fascia and tendon at neck level, initial encounter: Secondary | ICD-10-CM

## 2020-07-23 DIAGNOSIS — S39012A Strain of muscle, fascia and tendon of lower back, initial encounter: Secondary | ICD-10-CM | POA: Diagnosis not present

## 2020-07-23 MED ORDER — MELOXICAM 15 MG PO TBDP: 15.0000 mg | ORAL_TABLET | Freq: Every day | ORAL | 0 refills | Status: AC

## 2020-07-23 MED ORDER — BACLOFEN 10 MG PO TABS: 10.0000 mg | ORAL_TABLET | Freq: Three times a day (TID) | ORAL | 0 refills | Status: AC

## 2020-07-23 MED ORDER — TRAMADOL HCL 50 MG PO TABS: 50.0000 mg | ORAL_TABLET | Freq: Four times a day (QID) | ORAL | 0 refills | Status: AC | PRN
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
## 2020-07-23 NOTE — Discharge Instructions (Addendum)
BACK PAIN: Stressed avoiding painful activities . RICE (REST, ICE, COMPRESSION, ELEVATION) guidelines reviewed. May alternate ice and heat. Consider use of muscle rubs, Salonpas patches, etc. Use medications as directed including muscle relaxers if prescribed. Take anti-inflammatory medications as prescribed or OTC NSAIDs/Tylenol.  F/u with PCP in 7-10 days for reexamination, and please feel free to call or return to the urgent care at any time for any questions or concerns you may have and we will be happy to help you!   BACK PAIN RED FLAGS: If the back pain acutely worsens or there are any red flag symptoms such as numbness/tingling, leg weakness, saddle anesthesia, or loss of bowel/bladder control, go immediately to the ER. Follow up with [place] as scheduled or sooner if the pain does not begin to resolve or if it worsens before the follow up    NECK PAIN: Stressed avoiding painful activities. This can exacerbate your symptoms and make them worse.  May apply heat to the areas of pain for some relief. Use medications as directed. Be aware of which medications make you drowsy and do not drive or operate any kind of heavy machinery while using the medication (ie pain medications or muscle relaxers). F/U with PCP for reexamination or return sooner if condition worsens or does not begin to improve over the next few days.   NECK PAIN RED FLAGS: If symptoms get worse than they are right now, you should come back sooner for re-evaluation. If you have increased numbness/ tingling or notice that the numbness/tingling is affecting the legs or saddle region, go to ER. If you ever lose continence go to ER.      If not improving over next 2 weeks, follow up with Orthopedics. Please call one of the following officesfor appointment:   [facility] [address], [place], [place] [address] Phone: [phone]  [facility] [address], [place], [place] [address] Phone: [phone]

## 2021-08-22 ENCOUNTER — Telehealth: Payer: Self-pay | Admitting: Emergency Medicine

## 2021-08-22 ENCOUNTER — Ambulatory Visit
Admission: EM | Admit: 2021-08-22 | Discharge: 2021-08-22 | Disposition: A | Discharge status: Home / Self Care | Payer: Medicare Other | Attending: Emergency Medicine | Admitting: Emergency Medicine

## 2021-08-22 ENCOUNTER — Other Ambulatory Visit: Payer: Self-pay

## 2021-08-22 DIAGNOSIS — J40 Bronchitis, not specified as acute or chronic: Secondary | ICD-10-CM | POA: Diagnosis not present

## 2021-08-22 DIAGNOSIS — J069 Acute upper respiratory infection, unspecified: Secondary | ICD-10-CM | POA: Diagnosis not present

## 2021-08-22 MED ORDER — BENZONATATE 100 MG PO CAPS: 200.0000 mg | ORAL_CAPSULE | Freq: Three times a day (TID) | ORAL | 0 refills | Status: DC

## 2021-08-22 MED ORDER — ALBUTEROL SULFATE HFA 108 (90 BASE) MCG/ACT IN AERS: 1.0000 | INHALATION_SPRAY | Freq: Four times a day (QID) | RESPIRATORY_TRACT | 0 refills | Status: DC | PRN

## 2021-08-22 MED ORDER — ALBUTEROL SULFATE HFA 108 (90 BASE) MCG/ACT IN AERS: 1.0000 | INHALATION_SPRAY | Freq: Four times a day (QID) | RESPIRATORY_TRACT | 0 refills | Status: AC | PRN

## 2021-08-22 MED ORDER — IPRATROPIUM BROMIDE 0.06 % NA SOLN: 2.0000 | Freq: Four times a day (QID) | NASAL | 12 refills | Status: AC

## 2021-08-22 MED ORDER — DOXYCYCLINE HYCLATE 100 MG PO CAPS: 100.0000 mg | ORAL_CAPSULE | Freq: Two times a day (BID) | ORAL | 0 refills | Status: AC

## 2021-08-22 MED ORDER — PROMETHAZINE-DM 6.25-15 MG/5ML PO SYRP: 5.0000 mL | ORAL_SOLUTION | Freq: Four times a day (QID) | ORAL | 0 refills | Status: DC | PRN

## 2021-08-22 NOTE — ED Provider Notes (Signed)
[facility]    CSN: [id number] Arrival date & time: 08/22/21  5329      History   Chief Complaint Chief Complaint  Patient presents with   Cough    HPI Sabrina Owais Pruett is a 70 y.o. male.   HPI  70 year old male here for evaluation of respiratory complaints.  Patient reports that for the last 7 days he has been experiencing intermittent fevers, runny nose and nasal congestion for clear nasal discharge.  He has been taking DayQuil and NyQuil which has helped this symptom.  He also is experiencing intermittent sore throat, cough that is productive for yellow phlegm and occasionally has blood in it.  He denies hemoptysis.  He also experiences intermittent shortness of breath or wheezing.  He denies any ear pain.  Past Medical History:  Diagnosis Date   Diabetes (HCC)    Hypertension     There are no problems to display for this patient.   Past Surgical History:  Procedure Laterality Date   BLADDER SURGERY     HERNIA REPAIR         Home Medications    Prior to Admission medications   Medication Sig Start Date End Date Taking? Authorizing Provider  alfuzosin (UROXATRAL) 10 MG 24 hr tablet Take 10 mg by mouth daily with breakfast.   Yes [provider]  amLODipine (NORVASC) 10 MG tablet Take 10 mg by mouth daily.   Yes [provider]  benzonatate (TESSALON) 100 MG capsule Take 2 capsules (200 mg total) by mouth every 8 (eight) hours. 08/22/21  Yes Becky Augusta, NP  cholecalciferol (VITAMIN D3) 25 MCG (1000 UT) tablet Take 1,000 Units by mouth daily.   Yes [provider]  doxycycline (VIBRAMYCIN) 100 MG capsule Take 1 capsule (100 mg total) by mouth 2 (two) times daily for 7 days. 08/22/21 08/29/21 Yes Becky Augusta, NP  fluticasone [facility]) 50 MCG/ACT nasal spray Place 2 sprays into both nostrils daily.   Yes [provider]  glipiZIDE (GLUCOTROL XL) 5 MG 24 hr tablet Take 5 mg by mouth daily with breakfast.   Yes  [provider]  hydrALAZINE (APRESOLINE) 10 MG tablet Take 10 mg by mouth 2 (two) times daily. Take 2 tablet two times per day   Yes [provider]  hydrochlorothiazide (HYDRODIURIL) 25 MG tablet Take 25 mg by mouth daily.   Yes [provider]  ipratropium (ATROVENT) 0.06 % nasal spray Place 2 sprays into both nostrils 4 (four) times daily. 08/22/21  Yes Becky Augusta, NP  metFORMIN (GLUCOPHAGE) 500 MG tablet Take by mouth 2 (two) times daily with a meal.   Yes [provider]  promethazine-dextromethorphan (PROMETHAZINE-DM) 6.25-15 MG/5ML syrup Take 5 mLs by mouth 4 (four) times daily as needed. 08/22/21  Yes Becky Augusta, NP  thiamine 100 MG tablet Take 100 mg by mouth daily.   Yes [provider]  albuterol (VENTOLIN HFA) 108 (90 Base) MCG/ACT inhaler Inhale 1-2 puffs into the lungs every 6 (six) hours as needed for wheezing or shortness of breath. 08/22/21   Becky Augusta, NP    Family History Family History  Problem Relation Age of Onset   Diabetes Mother    Diabetes Father    Cancer Father        colon    Social History Social History   Tobacco Use   Smoking status: Every Day    Packs/day: 0.50    Types: Cigarettes   Smokeless tobacco: Never  Vaping  Use   Vaping Use: Never used  Substance Use Topics   Alcohol use: Yes    Alcohol/week: 2.0 standard drinks    Types: 2 Standard drinks or equivalent per week   Drug use: No     Allergies   Aspirin   Review of Systems Review of Systems  Constitutional:  Positive for fever. Negative for activity change and appetite change.  HENT:  Positive for congestion, rhinorrhea and sore throat. Negative for ear pain.   Respiratory:  Positive for cough, shortness of breath and wheezing.   Gastrointestinal:  Negative for diarrhea, nausea and vomiting.  Skin:  Negative for rash.  Hematological: Negative.   Psychiatric/Behavioral: Negative.      Physical Exam Triage Vital Signs ED Triage  Vitals  Enc Vitals Group     BP 08/22/21 0944 (!) 145/120     Pulse Rate 08/22/21 0944 70     Resp 08/22/21 0944 18     Temp 08/22/21 0944 98.5 F (36.9 C)     Temp Source 08/22/21 0944 Oral     SpO2 08/22/21 0944 99 %     Weight 08/22/21 0943 278 lb (126.1 kg)     Height 08/22/21 0943 6\' 2"  (1.88 m)     Head Circumference --      Peak Flow --      Pain Score 08/22/21 0943 0     Pain Loc --      Pain Edu? --      Excl. in GC? --    No data found.  Updated Vital Signs BP (!) 145/120 (BP Location: Left Arm)   Pulse 70   Temp 98.5 F (36.9 C) (Oral)   Resp 18   Ht 6\' 2"  (1.88 m)   Wt 278 lb (126.1 kg)   SpO2 99%   BMI 35.69 kg/m   Visual Acuity Right Eye Distance:   Left Eye Distance:   Bilateral Distance:    Right Eye Near:   Left Eye Near:    Bilateral Near:     Physical Exam Vitals and nursing note reviewed.  Constitutional:      General: He is not in acute distress.    Appearance: Normal appearance. He is not ill-appearing.  HENT:     Head: Normocephalic and atraumatic.     Right Ear: Tympanic membrane, ear canal and external ear normal. There is no impacted cerumen.     Left Ear: Tympanic membrane, ear canal and external ear normal. There is no impacted cerumen.     Nose: Congestion and rhinorrhea present.     Mouth/Throat:     Mouth: Mucous membranes are moist.     Pharynx: Oropharynx is clear. No posterior oropharyngeal erythema.  Cardiovascular:     Rate and Rhythm: Normal rate and regular rhythm.     Pulses: Normal pulses.     Heart sounds: Normal heart sounds. No murmur heard.   No gallop.  Pulmonary:     Effort: Pulmonary effort is normal.     Breath sounds: Wheezing and rhonchi present. No rales.  Musculoskeletal:     Cervical back: Normal range of motion and neck supple.  Lymphadenopathy:     Cervical: No cervical adenopathy.  Skin:    General: Skin is warm and dry.     Capillary Refill: Capillary refill takes less than 2 seconds.      Findings: No erythema or rash.  Neurological:     General: No focal deficit present.  Mental Status: He is alert and oriented to person, place, and time.  Psychiatric:        Mood and Affect: Mood normal.        Behavior: Behavior normal.        Thought Content: Thought content normal.        Judgment: Judgment normal.     UC Treatments / Results  Labs (all labs ordered are listed, but only abnormal results are displayed) Labs Reviewed - No data to display  EKG   Radiology No results found.  Procedures Procedures (including critical care time)  Medications Ordered in UC Medications - No data to display  Initial Impression / Assessment and Plan / UC Course  I have reviewed the triage vital signs and the nursing notes.  Pertinent labs & imaging results that were available during my care of the patient were reviewed by me and considered in my medical decision making (see chart for details).  Patient is a very pleasant, nontoxic-appearing 70 year old male here for evaluation of respiratory complaints as outlined in HPI above.  Patient's wife has similar symptoms to his but that is his only known sick contact.  Patient's physical exam reveals  Tympanic membrane's bilaterally with normal light reflex and clear external auditory canals.  Nasal mucosa is erythematous and edematous with clear nasal discharge in both nares.  Oropharyngeal exam is benign.  No cervical lymphadenopathy present on exam.  Cardiopulmonary exam reveals fine wheezes and rhonchi in bilateral upper lung fields.  Lower lung fields are clear.  Patient exam is consistent with an upper respiratory infection and bronchitis.  Will treat with doxycycline twice daily for 7 days for the bronchitis, Atrovent nasal spray, Tessalon Perles, Promethazine DM cough subtype with cough and congestion.  Will skip albuterol inhaler to help with shortness of breath and wheezing.   Final Clinical Impressions(s) / UC Diagnoses    Final diagnoses:  Upper respiratory tract infection, unspecified type  Bronchitis     Discharge Instructions      Take the Doxycycline twice daily with food for treatment of your bronchitis.  Use the Albuterol inhaler, 1-2 puffs every 4-6 hours as needed for shortness of breath and wheezing.   Use the Atrovent nasal spray, 2 squirts in each nostril every 6 hours, as needed for runny nose and postnasal drip.  Use the Tessalon Perles every 8 hours during the day.  Take them with a small sip of water.  They may give you some numbness to the base of your tongue or a metallic taste in your mouth, this is normal.  Use the Promethazine DM cough syrup at bedtime for cough and congestion.  It will make you drowsy so do not take it during the day.  Return for reevaluation or see your primary care provider for any new or worsening symptoms.      ED Prescriptions     Medication Sig Dispense Auth. Provider   albuterol (VENTOLIN HFA) 108 (90 Base) MCG/ACT inhaler Inhale 1-2 puffs into the lungs every 6 (six) hours as needed for wheezing or shortness of breath. 1 each Becky Augusta, NP   ipratropium (ATROVENT) 0.06 % nasal spray Place 2 sprays into both nostrils 4 (four) times daily. 15 mL Becky Augusta, NP   benzonatate (TESSALON) 100 MG capsule Take 2 capsules (200 mg total) by mouth every 8 (eight) hours. 21 capsule Becky Augusta, NP   promethazine-dextromethorphan (PROMETHAZINE-DM) 6.25-15 MG/5ML syrup Take 5 mLs by mouth 4 (four) times daily as needed.  118 mL Becky Augusta, NP   doxycycline (VIBRAMYCIN) 100 MG capsule Take 1 capsule (100 mg total) by mouth 2 (two) times daily for 7 days. 14 capsule Becky Augusta, NP      PDMP not reviewed this encounter.   Becky Augusta, NP 08/22/21 1038

## 2021-08-22 NOTE — ED Triage Notes (Signed)
Pt here with C/OBJECTIVE: cough, SOB and fatigue for 7 days.

## 2021-08-22 NOTE — Telephone Encounter (Signed)
Pharmacy called and needs the albuterol inhaler switched to the ProAir to be covered by his insurance.  Becky Augusta, NP approved and prescription sent to [facility] in [place].

## 2021-08-22 NOTE — Discharge Instructions (Signed)
Take the Doxycycline twice daily with food for treatment of your bronchitis.  Use the Albuterol inhaler, 1-2 puffs every 4-6 hours as needed for shortness of breath and wheezing.   Use the Atrovent nasal spray, 2 squirts in each nostril every 6 hours, as needed for runny nose and postnasal drip.  Use the Tessalon Perles every 8 hours during the day.  Take them with a small sip of water.  They may give you some numbness to the base of your tongue or a metallic taste in your mouth, this is normal.  Use the Promethazine DM cough syrup at bedtime for cough and congestion.  It will make you drowsy so do not take it during the day.  Return for reevaluation or see your primary care provider for any new or worsening symptoms.

## 2022-09-12 ENCOUNTER — Ambulatory Visit
Admission: EM | Admit: 2022-09-12 | Discharge: 2022-09-12 | Disposition: A | Discharge status: Home / Self Care | Payer: Medicare PPO | Attending: Urgent Care | Admitting: Urgent Care

## 2022-09-12 ENCOUNTER — Ambulatory Visit (INDEPENDENT_AMBULATORY_CARE_PROVIDER_SITE_OTHER): Payer: Medicare PPO

## 2022-09-12 DIAGNOSIS — R051 Acute cough: Secondary | ICD-10-CM

## 2022-09-12 DIAGNOSIS — J22 Unspecified acute lower respiratory infection: Secondary | ICD-10-CM | POA: Diagnosis not present

## 2022-09-12 DIAGNOSIS — R059 Cough, unspecified: Secondary | ICD-10-CM | POA: Diagnosis not present

## 2022-09-12 DIAGNOSIS — R6889 Other general symptoms and signs: Secondary | ICD-10-CM

## 2022-09-12 MED ORDER — GUAIFENESIN ER 600 MG PO TB12: 600.0000 mg | ORAL_TABLET | Freq: Two times a day (BID) | ORAL | 0 refills | Status: AC

## 2022-09-12 MED ORDER — PROMETHAZINE-DM 6.25-15 MG/5ML PO SYRP: 5.0000 mL | ORAL_SOLUTION | Freq: Four times a day (QID) | ORAL | 0 refills | Status: AC | PRN

## 2022-09-12 MED ORDER — BENZONATATE 100 MG PO CAPS: ORAL_CAPSULE | ORAL | 0 refills | Status: AC

## 2022-09-12 MED ORDER — AZITHROMYCIN 250 MG PO TABS: ORAL_TABLET | ORAL | 0 refills | Status: AC

## 2022-09-12 NOTE — ED Triage Notes (Signed)
Pt reports  can't sleep, deep coughing,spitting up mucuss for about a week now. Took nyquil, tylenol but no relief

## 2022-09-12 NOTE — Discharge Instructions (Signed)
Follow up here or with your primary care provider if your symptoms are worsening or not improving with treatment.     

## 2022-09-12 NOTE — ED Provider Notes (Signed)
[facility]    CSN: [id number] Arrival date & time: 09/12/22  5573      History   Chief Complaint No chief complaint on file.   HPI Alejandro Garza is a 71 y.o. male.   HPI  Presents to urgent care with complaint of difficulty sleeping as a result of cough, copious sputum production, symptoms x 1.5+ weeks.  Patient is using NyQuil for nighttime relief as well as Tylenol but reports no improvement in symptoms.  No documented fever however patient endorses chills and body aches.  Past Medical History:  Diagnosis Date   Diabetes (HCC)    Hypertension     There are no problems to display for this patient.   Past Surgical History:  Procedure Laterality Date   BLADDER SURGERY     HERNIA REPAIR         Home Medications    Prior to Admission medications   Medication Sig Start Date End Date Taking? Authorizing Provider  albuterol (PROAIR HFA) 108 (90 Base) MCG/ACT inhaler Inhale 1-2 puffs into the lungs every 6 (six) hours as needed for wheezing or shortness of breath. 08/22/21   Becky Augusta, NP  alfuzosin (UROXATRAL) 10 MG 24 hr tablet Take 10 mg by mouth daily with breakfast.    [provider]  amLODipine (NORVASC) 10 MG tablet Take 10 mg by mouth daily.    [provider]  benzonatate (TESSALON) 100 MG capsule Take 2 capsules (200 mg total) by mouth every 8 (eight) hours. 08/22/21   Becky Augusta, NP  cholecalciferol (VITAMIN D3) 25 MCG (1000 UT) tablet Take 1,000 Units by mouth daily.    [provider]  fluticasone (FLONASE) 50 MCG/ACT nasal spray Place 2 sprays into both nostrils daily.    [provider]  glipiZIDE (GLUCOTROL XL) 5 MG 24 hr tablet Take 5 mg by mouth daily with breakfast.    [provider]  hydrALAZINE (APRESOLINE) 10 MG tablet Take 10 mg by mouth 2 (two) times daily. Take 2 tablet two times per day    [provider]  hydrochlorothiazide (HYDRODIURIL) 25 MG tablet Take 25  mg by mouth daily.    [provider]  ipratropium (ATROVENT) 0.06 % nasal spray Place 2 sprays into both nostrils 4 (four) times daily. 08/22/21   Becky Augusta, NP  metFORMIN (GLUCOPHAGE) 500 MG tablet Take by mouth 2 (two) times daily with a meal.    [provider]  promethazine-dextromethorphan (PROMETHAZINE-DM) 6.25-15 MG/5ML syrup Take 5 mLs by mouth 4 (four) times daily as needed. 08/22/21   Becky Augusta, NP  thiamine 100 MG tablet Take 100 mg by mouth daily.    [provider]    Family History Family History  Problem Relation Age of Onset   Diabetes Mother    Diabetes Father    Cancer Father        colon    Social History Social History   Tobacco Use   Smoking status: Every Day    Packs/day: 0.50    Types: Cigarettes   Smokeless tobacco: Never  Vaping Use   Vaping Use: Never used  Substance Use Topics   Alcohol use: Yes    Alcohol/week: 2.0 standard drinks of alcohol    Types: 2 Standard drinks or equivalent per week   Drug use: No     Allergies   Aspirin   Review of Systems Review of Systems   Physical Exam Triage Vital Signs ED Triage Vitals  Enc Vitals Group     BP 09/12/22 0852 (!) 151/76     Pulse Rate 09/12/22 0852 69     Resp 09/12/22 0852 20     Temp 09/12/22 0852 97.8 F (36.6 C)     Temp Source 09/12/22 0852 Oral     SpO2 09/12/22 0852 99 %     Weight --      Height --      Head Circumference --      Peak Flow --      Pain Score 09/12/22 0853 0     Pain Loc --      Pain Edu? --      Excl. in GC? --    No data found.  Updated Vital Signs BP (!) 151/76 (BP Location: Left Arm)   Pulse 69   Temp 97.8 F (36.6 C) (Oral)   Resp 20   SpO2 99%   Visual Acuity Right Eye Distance:   Left Eye Distance:   Bilateral Distance:    Right Eye Near:   Left Eye Near:    Bilateral Near:     Physical Exam Vitals reviewed.  Constitutional:      Appearance: Normal appearance. He is ill-appearing. He is not  toxic-appearing.  HENT:     Head: Normocephalic and atraumatic.     Nose: Congestion present.  Eyes:     Conjunctiva/sclera: Conjunctivae normal.     Pupils: Pupils are equal, round, and reactive to light.  Cardiovascular:     Rate and Rhythm: Normal rate and regular rhythm.     Pulses: Normal pulses.     Heart sounds: Normal heart sounds.  Pulmonary:     Effort: Pulmonary effort is normal.     Breath sounds: Rhonchi present. No wheezing.  Skin:    General: Skin is warm and dry.  Neurological:     General: No focal deficit present.     Mental Status: He is alert and oriented to person, place, and time.  Psychiatric:        Mood and Affect: Mood normal.        Behavior: Behavior normal.      UC Treatments / Results  Labs (all labs ordered are listed, but only abnormal results are displayed) Labs Reviewed - No data to display  EKG   Radiology No results found.  Procedures Procedures (including critical care time)  Medications Ordered in UC Medications - No data to display  Initial Impression / Assessment and Plan / UC Course  I have reviewed the triage vital signs and the nursing notes.  Pertinent labs & imaging results that were available during my care of the patient were reviewed by me and considered in my medical decision making (see chart for details).   Patient is afebrile here without recent antipyretics. Satting well on room air. Overall is ill appearing, well hydrated, without respiratory distress. Pulmonary exam is remarkable for rhonchorous breathing and presence of severe productive cough triggered by deep breathing.   Chest x-ray ordered because of concern for CAP.  No acute findings.  PMH including DM 2.  Previous treatment for similar symptoms as today using albuterol.  Concern for bacterial secondary infection vs bronchitis, risk for poor outcome.  Will treat with azithromycin and recommend use of albuterol inhaler.  Will also represcribe  benzonatate and promethazine-DM as well as guaifenesin.   Final Clinical Impressions(s) / UC Diagnoses   Final diagnoses:  None   Discharge Instructions   None  ED Prescriptions   None    PDMP not reviewed this encounter.   Charma Igo, [place] 09/12/22 [phone]
